# Patient Record
Sex: Male | Born: 1990 | Race: Black or African American | Hispanic: No | Marital: Single | Smoking: Light tobacco smoker
Health system: Southern US, Community
[De-identification: ages and names within clinical notes are randomized; demographics above are authoritative.]

## PROBLEM LIST (undated history)

## (undated) DIAGNOSIS — J45909 Unspecified asthma, uncomplicated: Secondary | ICD-10-CM

## (undated) HISTORY — PX: TONSILLECTOMY: SUR1361

---

## 2019-03-09 ENCOUNTER — Other Ambulatory Visit: Payer: Self-pay

## 2019-03-09 ENCOUNTER — Emergency Department (HOSPITAL_COMMUNITY)
Admission: EM | Admit: 2019-03-09 | Discharge: 2019-03-09 | Disposition: A | Discharge status: Home / Self Care | Payer: Self-pay | Attending: Emergency Medicine | Admitting: Emergency Medicine

## 2019-03-09 ENCOUNTER — Emergency Department (HOSPITAL_COMMUNITY): Payer: Self-pay

## 2019-03-09 DIAGNOSIS — Y939 Activity, unspecified: Secondary | ICD-10-CM | POA: Insufficient documentation

## 2019-03-09 DIAGNOSIS — Y929 Unspecified place or not applicable: Secondary | ICD-10-CM | POA: Insufficient documentation

## 2019-03-09 DIAGNOSIS — S60221A Contusion of right hand, initial encounter: Secondary | ICD-10-CM | POA: Insufficient documentation

## 2019-03-09 DIAGNOSIS — Y999 Unspecified external cause status: Secondary | ICD-10-CM | POA: Insufficient documentation

## 2019-03-09 DIAGNOSIS — W2209XA Striking against other stationary object, initial encounter: Secondary | ICD-10-CM | POA: Insufficient documentation
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
## 2019-08-08 DIAGNOSIS — Z5321 Procedure and treatment not carried out due to patient leaving prior to being seen by health care provider: Secondary | ICD-10-CM | POA: Insufficient documentation

## 2019-08-08 DIAGNOSIS — K0889 Other specified disorders of teeth and supporting structures: Secondary | ICD-10-CM | POA: Insufficient documentation

## 2019-08-09 ENCOUNTER — Encounter (HOSPITAL_COMMUNITY): Payer: Self-pay

## 2019-08-09 ENCOUNTER — Emergency Department (HOSPITAL_COMMUNITY): Payer: Self-pay

## 2019-08-09 ENCOUNTER — Emergency Department (HOSPITAL_COMMUNITY)
Admission: EM | Admit: 2019-08-09 | Discharge: 2019-08-09 | Disposition: A | Discharge status: Home / Self Care | Payer: Self-pay | Attending: Emergency Medicine | Admitting: Emergency Medicine

## 2019-08-09 ENCOUNTER — Inpatient Hospital Stay (HOSPITAL_COMMUNITY)
Admission: EM | Admit: 2019-08-09 | Discharge: 2019-08-11 | DRG: 137 | Disposition: A | Discharge status: Home / Self Care | Payer: Self-pay | Attending: Internal Medicine | Admitting: Internal Medicine

## 2019-08-09 ENCOUNTER — Other Ambulatory Visit: Payer: Self-pay

## 2019-08-09 DIAGNOSIS — K045 Chronic apical periodontitis: Secondary | ICD-10-CM | POA: Diagnosis present

## 2019-08-09 DIAGNOSIS — Z20828 Contact with and (suspected) exposure to other viral communicable diseases: Secondary | ICD-10-CM | POA: Diagnosis present

## 2019-08-09 DIAGNOSIS — M264 Malocclusion, unspecified: Secondary | ICD-10-CM | POA: Diagnosis present

## 2019-08-09 DIAGNOSIS — R220 Localized swelling, mass and lump, head: Secondary | ICD-10-CM

## 2019-08-09 DIAGNOSIS — K083 Retained dental root: Secondary | ICD-10-CM | POA: Diagnosis present

## 2019-08-09 DIAGNOSIS — L03211 Cellulitis of face: Secondary | ICD-10-CM | POA: Diagnosis present

## 2019-08-09 DIAGNOSIS — K047 Periapical abscess without sinus: Principal | ICD-10-CM

## 2019-08-09 DIAGNOSIS — K029 Dental caries, unspecified: Secondary | ICD-10-CM | POA: Diagnosis present

## 2019-08-09 LAB — COMPREHENSIVE METABOLIC PANEL
ALT: 12 U/L (ref 0–44)
AST: 16 U/L (ref 15–41)
Albumin: 4 g/dL (ref 3.5–5.0)
Alkaline Phosphatase: 57 U/L (ref 38–126)
Anion gap: 11 (ref 5–15)
BUN: 11 mg/dL (ref 6–20)
CO2: 27 mmol/L (ref 22–32)
Calcium: 9.2 mg/dL (ref 8.9–10.3)
Chloride: 100 mmol/L (ref 98–111)
Creatinine, Ser: 1.02 mg/dL (ref 0.61–1.24)
GFR calc Af Amer: >60 mL/min (ref >60)
GFR calc non Af Amer: >60 mL/min (ref >60)
Glucose, Bld: 104 mg/dL — ABNORMAL HIGH (ref 70–99)
Potassium: 3.7 mmol/L (ref 3.5–5.1)
Sodium: 138 mmol/L (ref 135–145)
Total Bilirubin: 0.9 mg/dL (ref 0.3–1.2)
Total Protein: 7.3 g/dL (ref 6.5–8.1)

## 2019-08-09 LAB — CBC WITH DIFFERENTIAL/PLATELET
Abs Immature Granulocytes: 0.02 10*3/uL (ref 0.00–0.07)
Basophils Absolute: 0 10*3/uL (ref 0.0–0.1)
Basophils Relative: 0 %
Eosinophils Absolute: 0 10*3/uL (ref 0.0–0.5)
Eosinophils Relative: 0 %
HCT: 43.5 % (ref 39.0–52.0)
Hemoglobin: 14.3 g/dL (ref 13.0–17.0)
Immature Granulocytes: 0 %
Lymphocytes Relative: 17 %
Lymphs Abs: 1.6 10*3/uL (ref 0.7–4.0)
MCH: 29.7 pg (ref 26.0–34.0)
MCHC: 32.9 g/dL (ref 30.0–36.0)
MCV: 90.2 fL (ref 80.0–100.0)
Monocytes Absolute: 1.2 10*3/uL — ABNORMAL HIGH (ref 0.1–1.0)
Monocytes Relative: 13 %
Neutro Abs: 6.6 10*3/uL (ref 1.7–7.7)
Neutrophils Relative %: 70 %
Platelets: 194 10*3/uL (ref 150–400)
RBC: 4.82 MIL/uL (ref 4.22–5.81)
RDW: 13.3 % (ref 11.5–15.5)
WBC: 9.5 10*3/uL (ref 4.0–10.5)
nRBC: 0 % (ref 0.0–0.2)

## 2019-08-09 LAB — LACTIC ACID, PLASMA: Lactic Acid, Venous: 0.7 mmol/L (ref 0.5–1.9)

## 2019-08-09 MED ORDER — ONDANSETRON HCL 4 MG/2ML IJ SOLN: 4.0000 mg | Freq: Four times a day (QID) | INTRAMUSCULAR | Status: DC | PRN

## 2019-08-09 MED ORDER — MORPHINE SULFATE (PF) 4 MG/ML IV SOLN
4.0000 mg | Freq: Once | INTRAVENOUS | Status: AC
  Administered 2019-08-09: 19:00:00 4 mg via INTRAVENOUS
  Filled 2019-08-09: qty 1

## 2019-08-09 MED ORDER — ONDANSETRON HCL 4 MG/2ML IJ SOLN
4.0000 mg | Freq: Once | INTRAMUSCULAR | Status: AC
  Administered 2019-08-09: 4 mg via INTRAVENOUS
  Filled 2019-08-09: qty 2

## 2019-08-09 MED ORDER — SODIUM CHLORIDE 0.9% FLUSH
3.0000 mL | Freq: Once | INTRAVENOUS | Status: AC
  Administered 2019-08-09: 3 mL via INTRAVENOUS

## 2019-08-09 MED ORDER — ACETAMINOPHEN 650 MG RE SUPP: 650.0000 mg | Freq: Four times a day (QID) | RECTAL | Status: DC | PRN

## 2019-08-09 MED ORDER — IOHEXOL 300 MG/ML  SOLN
75.0000 mL | Freq: Once | INTRAMUSCULAR | Status: AC | PRN
  Administered 2019-08-09: 75 mL via INTRAVENOUS

## 2019-08-09 MED ORDER — VANCOMYCIN HCL 10 G IV SOLR
1250.0000 mg | Freq: Two times a day (BID) | INTRAVENOUS | Status: DC
  Filled 2019-08-09 (×2): qty 1250

## 2019-08-09 MED ORDER — CLINDAMYCIN PHOSPHATE 600 MG/50ML IV SOLN
600.0000 mg | Freq: Once | INTRAVENOUS | Status: AC
  Administered 2019-08-09: 600 mg via INTRAVENOUS
  Filled 2019-08-09: qty 50

## 2019-08-09 MED ORDER — BUPIVACAINE HCL 0.25 % IJ SOLN
10.0000 mL | Freq: Once | INTRAMUSCULAR | Status: AC
  Administered 2019-08-09: 10 mL
  Filled 2019-08-09: qty 10

## 2019-08-09 MED ORDER — OXYCODONE-ACETAMINOPHEN 5-325 MG PO TABS
1.0000 | ORAL_TABLET | ORAL | Status: DC | PRN
  Administered 2019-08-09: 18:00:00 1 via ORAL
  Filled 2019-08-09: qty 1

## 2019-08-09 MED ORDER — CLINDAMYCIN PHOSPHATE 300 MG/50ML IV SOLN
300.0000 mg | Freq: Three times a day (TID) | INTRAVENOUS | Status: DC
  Administered 2019-08-10 – 2019-08-11 (×5): 300 mg via INTRAVENOUS
  Filled 2019-08-09 (×9): qty 50

## 2019-08-09 MED ORDER — SODIUM CHLORIDE 0.9 % IV BOLUS
1000.0000 mL | Freq: Once | INTRAVENOUS | Status: AC
  Administered 2019-08-09: 19:00:00 1000 mL via INTRAVENOUS

## 2019-08-09 MED ORDER — FENTANYL CITRATE (PF) 100 MCG/2ML IJ SOLN
100.0000 ug | Freq: Once | INTRAMUSCULAR | Status: AC
  Administered 2019-08-09: 100 ug via INTRAVENOUS
  Filled 2019-08-09 (×2): qty 2

## 2019-08-09 MED ORDER — DEXAMETHASONE SODIUM PHOSPHATE 10 MG/ML IJ SOLN
10.0000 mg | Freq: Once | INTRAMUSCULAR | Status: AC
  Administered 2019-08-09: 19:00:00 10 mg via INTRAVENOUS
  Filled 2019-08-09: qty 1

## 2019-08-09 MED ORDER — ACETAMINOPHEN 500 MG PO TABS
1000.0000 mg | ORAL_TABLET | Freq: Once | ORAL | Status: AC
  Administered 2019-08-09: 19:00:00 1000 mg via ORAL
  Filled 2019-08-09: qty 2

## 2019-08-09 MED ORDER — SODIUM CHLORIDE 0.9 % IV SOLN
INTRAVENOUS | Status: DC
  Administered 2019-08-09 – 2019-08-10 (×2): via INTRAVENOUS

## 2019-08-09 MED ORDER — ONDANSETRON HCL 4 MG/2ML IJ SOLN
4.0000 mg | Freq: Once | INTRAMUSCULAR | Status: AC
  Administered 2019-08-09: 4 mg via INTRAVENOUS
  Filled 2019-08-09 (×2): qty 2

## 2019-08-09 MED ORDER — ACETAMINOPHEN 325 MG PO TABS: 650.0000 mg | ORAL_TABLET | Freq: Four times a day (QID) | ORAL | Status: DC | PRN

## 2019-08-09 MED ORDER — MORPHINE SULFATE (PF) 2 MG/ML IV SOLN: 2.0000 mg | INTRAVENOUS | Status: DC | PRN

## 2019-08-09 MED ORDER — ONDANSETRON HCL 4 MG PO TABS: 4.0000 mg | ORAL_TABLET | Freq: Four times a day (QID) | ORAL | Status: DC | PRN

## 2019-08-09 NOTE — Progress Notes (Signed)
Pharmacy Antibiotic Note  Juan Hines is a 28 y.o. male admitted on 08/09/2019 with cellulitis.  Pharmacy has been consulted for Vancomycin dosing.  Height: 5\' 6"  (167.6 cm) Weight: 170 lb (77.1 kg) IBW/kg (Calculated) : 63.8  Temp (24hrs), Avg:99.4 F (37.4 C), Min:98.6 F (37 C), Max:101.1 F (38.4 C)  Recent Labs  Lab 08/09/19 1743 08/09/19 2136  WBC 9.5  --   CREATININE 1.02  --   LATICACIDVEN  --  0.7    Estimated Creatinine Clearance: 105.4 mL/min (by C-G formula based on SCr of 1.02 mg/dL).    No Known Allergies  Antimicrobials this admission: 11/9 Vancomycin >>   Microbiology results: N/a  Plan: - Will not load the patient since treatment for cellulitis  - Will start Vancomycin 1250 mg IV q12h  - Calculated est AUC 528 - Continue to monitor patient's renal function and opportunity to deescalate therapy when indicated.  Thank you for allowing pharmacy to be a part of this patient's care.  Duanne Limerick PharmD. BCPS  08/09/2019 10:39 PM

## 2019-08-09 NOTE — ED Triage Notes (Signed)
Pt reports right sided facial swelling and headache for the past 3 days, significant swelling noted. Pt a.o, maintaining secretions, airway intact

## 2019-08-09 NOTE — H&P (Signed)
History and Physical   Juan Hines WUJ:[id number] DOB: 04-Oct-1990 DOA: 08/09/2019  Referring MD/NP/PA: Dr. Rubin PayorPickering  PCP: Patient, No Pcp Per   Outpatient Specialists: None  Patient coming from: Home  Chief Complaint: Right facial swelling  HPI: Juan Hines is a 28 y.o. male with medical history significant of no significant past medical history who started having right lower tooth ache about 4 days ago.  Is status swelling with pain up to 10 out of 10.  Patient did not take anything but the pain continues to worsen and the swelling continues to worsen.  He has some fever and chills.  He started having problem swallowing with excessive salivation.  Patient came to the ER where he was seen and evaluated.  He was found to have severe dental abscess with accompanying facial cellulitis on the right.  He is beginning to have problems swallowing and with the severity of his presentation patient is being admitted for IV antibiotics and treatment.  ED Course: Temperature is 101.1 Hines pressure 150/83 pulse 80 respiratory rate of 18 oxygen sat 100% room air.  CBC and chemistry appear to be within normal.  Lactic acid 0.7.  COVID-19 is negative.  CT neck shows large periapical lucency at the root of tooth #30 with dehiscence of the anterior mandibular cortex and subperiosteal abscess of over 2.4 cm.  There was inflammation of the lower right facial soft tissues consistent with untoward neurogenic cellulitis with reactive cervical lymphadenopathy.  Review of Systems: As per HPI otherwise 10 point review of systems negative.    History reviewed. No pertinent past medical history.  History reviewed. No pertinent surgical history.   has no history on file for tobacco, alcohol, and drug.  No Known Allergies  No family history on file.   Prior to Admission medications   Not on File    Physical Exam: Vitals:   08/09/19 1724 08/09/19 1725 08/09/19 2129  BP: (!) 150/83    Pulse: 80     Resp: 18    Temp: (!) 101.1 F (38.4 C)  98.6 F (37 C)  TempSrc: Oral  Oral  SpO2: 100%    Weight:  77.1 kg   Height:  5\' 6"  (1.676 m)       Constitutional: NAD, calm, right facial swelling Vitals:   08/09/19 1724 08/09/19 1725 08/09/19 2129  BP: (!) 150/83    Pulse: 80    Resp: 18    Temp: (!) 101.1 F (38.4 C)  98.6 F (37 C)  TempSrc: Oral  Oral  SpO2: 100%    Weight:  77.1 kg   Height:  5\' 6"  (1.676 m)    Eyes: PERRL, lids and conjunctivae normal ENMT: Mucous membranes are moist. Posterior pharynx clear of any exudate or lesions.right lower jaw swollen tender warm to touch not fluctuant Neck: normal, supple, no masses, no thyromegaly Respiratory: clear to auscultation bilaterally, no wheezing, no crackles. Normal respiratory effort. No accessory muscle use.  Cardiovascular: Regular rate and rhythm, no murmurs / rubs / gallops. No extremity edema. 2+ pedal pulses. No carotid bruits.  Abdomen: no tenderness, no masses palpated. No hepatosplenomegaly. Bowel sounds positive.  Musculoskeletal: no clubbing / cyanosis. No joint deformity upper and lower extremities. Good ROM, no contractures. Normal muscle tone.  Skin: no rashes, lesions, ulcers. No induration Neurologic: CN 2-12 grossly intact. Sensation intact, DTR normal. Strength 5/5 in all 4.  Psychiatric: Normal judgment and insight. Alert and oriented x 3. Normal mood.     Labs  on Admission: I have personally reviewed following labs and imaging studies  CBC: Recent Labs  Lab 08/09/19 1743  WBC 9.5  NEUTROABS 6.6  HGB 14.3  HCT 43.5  MCV 90.2  PLT 166   Basic Metabolic Panel: Recent Labs  Lab 08/09/19 1743  NA 138  K 3.7  CL 100  CO2 27  GLUCOSE 104*  BUN 11  CREATININE 1.02  CALCIUM 9.2   GFR: Estimated Creatinine Clearance: 105.4 mL/min (by C-G formula based on SCr of 1.02 mg/dL). Liver Function Tests: Recent Labs  Lab 08/09/19 1743  AST 16  ALT 12  ALKPHOS 57  BILITOT 0.9  PROT 7.3   ALBUMIN 4.0   No results for input(s): LIPASE, AMYLASE in the last 168 hours. No results for input(s): AMMONIA in the last 168 hours. Coagulation Profile: No results for input(s): INR, PROTIME in the last 168 hours. Cardiac Enzymes: No results for input(s): CKTOTAL, CKMB, CKMBINDEX, TROPONINI in the last 168 hours. BNP (last 3 results) No results for input(s): PROBNP in the last 8760 hours. HbA1C: No results for input(s): HGBA1C in the last 72 hours. CBG: No results for input(s): GLUCAP in the last 168 hours. Lipid Profile: No results for input(s): CHOL, HDL, LDLCALC, TRIG, CHOLHDL, LDLDIRECT in the last 72 hours. Thyroid Function Tests: No results for input(s): TSH, T4TOTAL, FREET4, T3FREE, THYROIDAB in the last 72 hours. Anemia Panel: No results for input(s): VITAMINB12, FOLATE, FERRITIN, TIBC, IRON, RETICCTPCT in the last 72 hours. Urine analysis: No results found for: COLORURINE, APPEARANCEUR, LABSPEC, PHURINE, GLUCOSEU, HGBUR, BILIRUBINUR, KETONESUR, PROTEINUR, UROBILINOGEN, NITRITE, LEUKOCYTESUR Sepsis Labs: @LABRCNTIP (procalcitonin:4,lacticidven:4) )No results found for this or any previous visit (from the past 240 hour(s)).   Radiological Exams on Admission: Ct Soft Tissue Neck W Contrast  Result Date: 08/09/2019 CLINICAL DATA:  Sore throat and stridor. EXAM: CT NECK WITH CONTRAST TECHNIQUE: Multidetector CT imaging of the neck was performed using the standard protocol following the bolus administration of intravenous contrast. CONTRAST:  67mL OMNIPAQUE IOHEXOL 300 MG/ML  SOLN COMPARISON:  None. FINDINGS: PHARYNX AND LARYNX: --Nasopharynx: Fossae of Rosenmuller are clear. Normal adenoid tonsils for age. --Oral cavity and oropharynx: There is a large periapical lucency at the root of tooth 30 with dehiscence of the anterior mandibular cortex. There is a subperiosteal abscess measuring 2.4 by 1.4 cm. There is a large amount of overlying soft tissue swelling. --Hypopharynx: Normal  vallecula and pyriform sinuses. --Larynx: Normal epiglottis and pre-epiglottic space. Normal aryepiglottic and vocal folds. --Retropharyngeal space: No abscess, effusion or lymphadenopathy. SALIVARY GLANDS: --Parotid: No mass lesion or inflammation. No sialolithiasis or ductal dilatation. --Submandibular: Symmetric without inflammation. No sialolithiasis or ductal dilatation. --Sublingual: Normal. No ranula or other visible lesion of the base of tongue and floor of mouth. THYROID: Normal. LYMPH NODES: Level 1A lymph nodes measure up to 8 mm. There are numerous bilateral subcentimeter cervical lymph nodes. VASCULAR: Major cervical vessels are patent. LIMITED INTRACRANIAL: Normal. VISUALIZED ORBITS: Normal. MASTOIDS AND VISUALIZED PARANASAL SINUSES: No fluid levels or advanced mucosal thickening. No mastoid effusion. SKELETON: No bony spinal canal stenosis. No lytic or blastic lesions. UPPER CHEST: Clear. OTHER: None. IMPRESSION: 1. Large periapical lucency at the root of tooth 30 with dehiscence of the anterior mandibular cortex and subperiosteal abscess measuring up to 2.4 cm. 2. Inflammation of the lower right facial soft tissues, consistent with odontogenic cellulitis. 3. Reactive cervical lymphadenopathy. Electronically Signed   By: Ulyses Jarred M.D.   On: 08/09/2019 20:43      Assessment/Plan Principal  Problem:   Dental abscess Active Problems:   Facial cellulitis     #1 right dental abscess: Patient will be admitted for IV antibiotics.  I will start him on IV clindamycin.  We will consult dental surgery either in or outpatient once patient improves.  Get Hines cultures.  Pain management and fever management  #2 facial cellulitis: As a consequence of dental abscess.  Continue treatment with antibiotics as above   DVT prophylaxis: SCD Code Status: Full code Family Communication: The patient's significant other on the phone Disposition Plan: Home Consults called: None.  Dental surgery  consult in the morning Admission status: Observation  Severity of Illness: The appropriate patient status for this patient is OBSERVATION. Observation status is judged to be reasonable and necessary in order to provide the required intensity of service to ensure the patient's safety. The patient's presenting symptoms, physical exam findings, and initial radiographic and laboratory data in the context of their medical condition is felt to place them at decreased risk for further clinical deterioration. Furthermore, it is anticipated that the patient will be medically stable for discharge from the hospital within 2 midnights of admission. The following factors support the patient status of observation.   " The patient's presenting symptoms include right facial swelling. " The physical exam findings include swelling and tenderness of the right face. " The initial radiographic and laboratory data are dental abscess.     Juan Blood MD [facility] Pager [phone]  If 7PM-7AM, please contact night-coverage [email or web address] Password [email or web address]  08/09/2019, 10:14 PM

## 2019-08-09 NOTE — ED Triage Notes (Signed)
Per pt he has been having facial swelling with jaw pain for about 2 days. Painful to chew. Pt said back jaw tooth is bad.

## 2019-08-09 NOTE — ED Notes (Signed)
Pt did not respond when called for vitals check. Registration stated she seen pt leave and has not seen him return

## 2019-08-09 NOTE — ED Provider Notes (Signed)
[name] Provider Note   CSN: [id number] Arrival date & time: 08/09/19  1641     History   Chief Complaint Chief Complaint  Patient presents with  . Dental Pain  . Facial Swelling    HPI Juan Hines is a 28 y.o. male who presents for evaluation of 5 days of dental pain to the right lower jaw.  He states that over the last 2 days, he has had worsening pain, swelling of the right face.  He states he has been taking over-the-counter pain medication with no improvement in symptoms.  He states that he has not noticed any fever at home.  He states that he can still tolerate his secretions and p.o. but states he has had a very soft food because it hurts more to eat.  He states he also has a headache that extends across the front of his head.  He states that he feels like the pain radiated to his right ear.  Patient Nuys any difficulty breathing, vomiting.      The history is provided by the patient.    History reviewed. No pertinent past medical history.  Patient Active Problem List   Diagnosis Date Noted  . Dental abscess 08/09/2019  . Facial cellulitis 08/09/2019    History reviewed. No pertinent surgical history.      Home Medications    Prior to Admission medications   Not on File    Family History No family history on file.  Social History Social History   Tobacco Use  . Smoking status: Not on file  Substance Use Topics  . Alcohol use: Not on file  . Drug use: Not on file     Allergies   Patient has no known allergies.   Review of Systems Review of Systems  Constitutional: Negative for fever.  HENT: Positive for dental problem and facial swelling.   Respiratory: Negative for shortness of breath.   Gastrointestinal: Negative for nausea and vomiting.  All other systems reviewed and are negative.    Physical Exam Updated Vital Signs BP (!) 150/83   Pulse 80   Temp 98.6 F (37 C) (Oral)   Resp 18   Ht  5\' 6"  (1.676 m)   Wt 77.1 kg   SpO2 100%   BMI 27.44 kg/m   Physical Exam Vitals signs and nursing note reviewed.  Constitutional:      Appearance: He is well-developed.  HENT:     Head: Normocephalic and atraumatic.     Comments: Patient is asymmetric in appearance with significant edema, tenderness noted to right lower face that extends to the right chin.    Mouth/Throat:     Comments: Trismus noted.  He can open to about two fingers width.  He has significant edema, tenderness noted to the right side of his face.  Limited evaluation of posterior oropharynx.  He does have tenderness noted to the right lower jaw but no obvious abscess or area of fluctuance.  He does have tenderness noted to the submandibular region that extends to the chin.  No obvious overlying swelling. Eyes:     General: No scleral icterus.       Right eye: No discharge.        Left eye: No discharge.     Conjunctiva/sclera: Conjunctivae normal.  Pulmonary:     Effort: Pulmonary effort is normal.  Skin:    General: Skin is warm and dry.  Neurological:     Mental  Status: He is alert.  Psychiatric:        Speech: Speech normal.        Behavior: Behavior normal.      ED Treatments / Results  Labs (all labs ordered are listed, but only abnormal results are displayed) Labs Reviewed  COMPREHENSIVE METABOLIC PANEL - Abnormal; Notable for the following components:      Result Value   Glucose, Bld 104 (*)    All other components within normal limits  CBC WITH DIFFERENTIAL/PLATELET - Abnormal; Notable for the following components:   Monocytes Absolute 1.2 (*)    All other components within normal limits  SARS CORONAVIRUS 2 (TAT 6-24 HRS)  LACTIC ACID, PLASMA  HIV ANTIBODY (ROUTINE TESTING W REFLEX)  COMPREHENSIVE METABOLIC PANEL  CBC    EKG None  Radiology Ct Soft Tissue Neck W Contrast  Result Date: 08/09/2019 CLINICAL DATA:  Sore throat and stridor. EXAM: CT NECK WITH CONTRAST TECHNIQUE:  Multidetector CT imaging of the neck was performed using the standard protocol following the bolus administration of intravenous contrast. CONTRAST:  75mL OMNIPAQUE IOHEXOL 300 MG/ML  SOLN COMPARISON:  None. FINDINGS: PHARYNX AND LARYNX: --Nasopharynx: Fossae of Rosenmuller are clear. Normal adenoid tonsils for age. --Oral cavity and oropharynx: There is a large periapical lucency at the root of tooth 30 with dehiscence of the anterior mandibular cortex. There is a subperiosteal abscess measuring 2.4 by 1.4 cm. There is a large amount of overlying soft tissue swelling. --Hypopharynx: Normal vallecula and pyriform sinuses. --Larynx: Normal epiglottis and pre-epiglottic space. Normal aryepiglottic and vocal folds. --Retropharyngeal space: No abscess, effusion or lymphadenopathy. SALIVARY GLANDS: --Parotid: No mass lesion or inflammation. No sialolithiasis or ductal dilatation. --Submandibular: Symmetric without inflammation. No sialolithiasis or ductal dilatation. --Sublingual: Normal. No ranula or other visible lesion of the base of tongue and floor of mouth. THYROID: Normal. LYMPH NODES: Level 1A lymph nodes measure up to 8 mm. There are numerous bilateral subcentimeter cervical lymph nodes. VASCULAR: Major cervical vessels are patent. LIMITED INTRACRANIAL: Normal. VISUALIZED ORBITS: Normal. MASTOIDS AND VISUALIZED PARANASAL SINUSES: No fluid levels or advanced mucosal thickening. No mastoid effusion. SKELETON: No bony spinal canal stenosis. No lytic or blastic lesions. UPPER CHEST: Clear. OTHER: None. IMPRESSION: 1. Large periapical lucency at the root of tooth 30 with dehiscence of the anterior mandibular cortex and subperiosteal abscess measuring up to 2.4 cm. 2. Inflammation of the lower right facial soft tissues, consistent with odontogenic cellulitis. 3. Reactive cervical lymphadenopathy. Electronically Signed   By: Deatra Robinson M.D.   On: 08/09/2019 20:43    Procedures .Marland KitchenIncision and Drainage   Date/Time: 08/09/2019 9:52 PM Performed by: Maxwell Caul, PA-C Authorized by: Maxwell Caul, PA-C   Consent:    Consent obtained:  Verbal   Consent given by:  Patient   Risks discussed:  Bleeding, incomplete drainage, pain and damage to other organs   Alternatives discussed:  No treatment Universal protocol:    Procedure explained and questions answered to patient or proxy's satisfaction: yes     Relevant documents present and verified: yes     Test results available and properly labeled: yes     Imaging studies available: yes     Required blood products, implants, devices, and special equipment available: yes     Site/side marked: yes     Immediately prior to procedure a time out was called: yes     Patient identity confirmed:  Verbally with patient Location:    Type:  Abscess  Location:  Mouth Pre-procedure details:    Skin preparation:  Betadine Anesthesia (see MAR for exact dosages):    Anesthesia method:  Local infiltration   Local anesthetic:  Bupivacaine 0.25% w/o epi Procedure type:    Complexity:  Complex Procedure details:    Incision types:  Single straight   Incision depth:  Subcutaneous   Scalpel blade:  11   Wound management:  Probed and deloculated, irrigated with saline and extensive cleaning   Drainage:  Purulent   Drainage amount:  Moderate Post-procedure details:    Patient tolerance of procedure:  Tolerated well, no immediate complications   (including critical care time)  Medications Ordered in ED Medications  oxyCODONE-acetaminophen (PERCOCET/ROXICET) 5-325 MG per tablet 1 tablet (1 tablet Oral Given 08/09/19 1731)  0.9 %  sodium chloride infusion ( Intravenous New Bag/Given 08/09/19 2258)  acetaminophen (TYLENOL) tablet 650 mg (has no administration in time range)    Or  acetaminophen (TYLENOL) suppository 650 mg (has no administration in time range)  ondansetron (ZOFRAN) tablet 4 mg (has no administration in time range)    Or   ondansetron (ZOFRAN) injection 4 mg (has no administration in time range)  clindamycin (CLEOCIN) IVPB 300 mg (has no administration in time range)  morphine 2 MG/ML injection 2 mg (has no administration in time range)  sodium chloride flush (NS) 0.9 % injection 3 mL (3 mLs Intravenous Given 08/09/19 1901)  sodium chloride 0.9 % bolus 1,000 mL (0 mLs Intravenous Stopped 08/09/19 2127)  acetaminophen (TYLENOL) tablet 1,000 mg (1,000 mg Oral Given 08/09/19 1901)  ondansetron (ZOFRAN) injection 4 mg (4 mg Intravenous Given 08/09/19 1901)  morphine 4 MG/ML injection 4 mg (4 mg Intravenous Given 08/09/19 1901)  dexamethasone (DECADRON) injection 10 mg (10 mg Intravenous Given 08/09/19 1901)  iohexol (OMNIPAQUE) 300 MG/ML solution 75 mL (75 mLs Intravenous Contrast Given 08/09/19 2004)  clindamycin (CLEOCIN) IVPB 600 mg (0 mg Intravenous Stopped 08/09/19 2158)  bupivacaine (MARCAINE) 0.25 % (with pres) injection 10 mL (10 mLs Infiltration Given 08/09/19 2221)  fentaNYL (SUBLIMAZE) injection 100 mcg (100 mcg Intravenous Given 08/09/19 2302)  ondansetron (ZOFRAN) injection 4 mg (4 mg Intravenous Given 08/09/19 2302)     Initial Impression / Assessment and Plan / ED Course  I have reviewed the triage vital signs and the nursing notes.  Pertinent labs & imaging results that were available during my care of the patient were reviewed by me and considered in my medical decision making (see chart for details).        28 year old male who presents for evaluation of 5 days of right dental pain as well as 2 days of pain, swelling.  On initial ED arrival, he is febrile, hypertensive.  Vitals otherwise stable.  He is tolerating secretions and does not have any difficulty breathing.  He does have some trismus noted on exam and face is asymmetric with edema noted to the right lower face.  Concern for dental abscess versus Ludwig angina versus posterior oropharynx abscess.  Labs ordered at triage.  We will plan to obtain  CT soft tissue neck.  CBC shows no leukocytosis.  Hemoglobin stable.  CMP is unremarkable.  CT shows large periapical lucency at the root of tooth #30 with dehiscence of the anterior mandibular cortex with subperiosteal abscess measuring up to 2.4 cm.  He also has inflammation of the right lower facial soft tissues consistent with cellulitis.  Given findings of abscess, I discussed incision and drainage with patient.  He is agreeable.  Incision and drainage of the abscess as documented above.  There was a good amount of purulent drainage.  He did have some improvement in pain.  Given concerns of fever, cellulitis surrounding the abscess, feel that he would best be served by admission with IV antibiotics.  Discussed with Dr. Rubin Payor who agrees to plan.  Discussed patient with Dr. Mikeal Hawthorne (hospitalist) who accepts patient for admission.  Portions of this note were generated with Scientist, clinical (histocompatibility and immunogenetics). Dictation errors may occur despite best attempts at proofreading.  Final Clinical Impressions(s) / ED Diagnoses   Final diagnoses:  Dental abscess  Facial cellulitis    ED Discharge Orders    None       Rosana Hoes 08/09/19 2348    Benjiman Core, MD 08/12/19 [phone]

## 2019-08-10 ENCOUNTER — Encounter (HOSPITAL_COMMUNITY): Payer: Self-pay

## 2019-08-10 ENCOUNTER — Inpatient Hospital Stay (HOSPITAL_COMMUNITY): Payer: Self-pay

## 2019-08-10 DIAGNOSIS — L03211 Cellulitis of face: Secondary | ICD-10-CM

## 2019-08-10 LAB — COMPREHENSIVE METABOLIC PANEL
ALT: 10 U/L (ref 0–44)
AST: 16 U/L (ref 15–41)
Albumin: 3.3 g/dL — ABNORMAL LOW (ref 3.5–5.0)
Alkaline Phosphatase: 53 U/L (ref 38–126)
Anion gap: 9 (ref 5–15)
BUN: 9 mg/dL (ref 6–20)
CO2: 24 mmol/L (ref 22–32)
Calcium: 8.9 mg/dL (ref 8.9–10.3)
Chloride: 102 mmol/L (ref 98–111)
Creatinine, Ser: 1.08 mg/dL (ref 0.61–1.24)
GFR calc Af Amer: >60 mL/min (ref >60)
GFR calc non Af Amer: >60 mL/min (ref >60)
Glucose, Bld: 145 mg/dL — ABNORMAL HIGH (ref 70–99)
Potassium: 4.3 mmol/L (ref 3.5–5.1)
Sodium: 135 mmol/L (ref 135–145)
Total Bilirubin: 0.7 mg/dL (ref 0.3–1.2)
Total Protein: 6.2 g/dL — ABNORMAL LOW (ref 6.5–8.1)

## 2019-08-10 LAB — CBC
HCT: 41.2 % (ref 39.0–52.0)
Hemoglobin: 13.6 g/dL (ref 13.0–17.0)
MCH: 29.7 pg (ref 26.0–34.0)
MCHC: 33 g/dL (ref 30.0–36.0)
MCV: 90 fL (ref 80.0–100.0)
Platelets: 197 10*3/uL (ref 150–400)
RBC: 4.58 MIL/uL (ref 4.22–5.81)
RDW: 13.2 % (ref 11.5–15.5)
WBC: 8.8 10*3/uL (ref 4.0–10.5)
nRBC: 0 % (ref 0.0–0.2)

## 2019-08-10 LAB — SARS CORONAVIRUS 2 (TAT 6-24 HRS): SARS Coronavirus 2: NEGATIVE

## 2019-08-10 LAB — HIV ANTIBODY (ROUTINE TESTING W REFLEX): HIV Screen 4th Generation wRfx: NONREACTIVE

## 2019-08-10 MED ORDER — IBUPROFEN 400 MG PO TABS
400.0000 mg | ORAL_TABLET | Freq: Three times a day (TID) | ORAL | Status: DC
  Administered 2019-08-10 – 2019-08-11 (×2): 400 mg via ORAL
  Filled 2019-08-10 (×2): qty 1

## 2019-08-10 NOTE — Consult Note (Signed)
DENTAL CONSULTATION  Date of Consultation:  08/10/2019 Patient Name:   Juan Hines Date of Birth:   [birth year] Medical Record Number: [id number]  COVID 19 SCREENING: The patient does not symptoms concerning for COVID-19 infection (Including fever, chills, cough, or new SHORTNESS OF BREATH).    VITALS: BP 117/69 (BP Location: Right Arm)   Pulse 76   Temp 98.3 F (36.8 C) (Oral)   Resp 18   Ht 5\' 6"  (1.676 m)   Wt 77.1 kg   SpO2 100%   BMI 27.44 kg/m   CHIEF COMPLAINT: Patient referred by Dr. Algis Liming for a dental consultation.  HPI: Juan Hines is a 28 year old male recently admitted and placed in observation for right facial swelling.  Patient has been on IV antibiotic therapy originally with vancomycin and now clindamycin.  Dental consultation requested for evaluation of patient for dental etiology of the right facial swelling.    The patient has been having a history of dental pain involving the lower right quadrant since 08/07/2019.  The patient points to tooth #30 as the offending tooth.  The pain and swelling worsened to the point that the patient went to the emergency department on Monday.  Patient was subsequently admitted and placed on IV antibiotic therapy.  The swelling and discomfort have subsided significantly since he has been administered the IV antibiotics.  Patient last saw a dentist approximately a year ago for a dental cleaning in [place], [place].  The patient cannot remember the name of the dentist.  The patient denies having partial dentures.  Patient denies having dental phobia.  PROBLEM LIST: Patient Active Problem List   Diagnosis Date Noted  . Dental abscess 08/09/2019  . Facial cellulitis 08/09/2019    PMH: History reviewed. No pertinent past medical history.  PSH: History reviewed. No pertinent surgical history.  ALLERGIES: No Known Allergies  MEDICATIONS: Current Facility-Administered Medications  Medication Dose Route Frequency  Provider Last Rate Last Dose  . 0.9 %  sodium chloride infusion   Intravenous Continuous Elwyn Reach, MD 100 mL/hr at 08/09/19 2258    . acetaminophen (TYLENOL) tablet 650 mg  650 mg Oral Q6H PRN Elwyn Reach, MD       Or  . acetaminophen (TYLENOL) suppository 650 mg  650 mg Rectal Q6H PRN Gala Romney L, MD      . clindamycin (CLEOCIN) IVPB 300 mg  300 mg Intravenous Q8H Elwyn Reach, MD   Stopped at 08/10/19 0734  . morphine 2 MG/ML injection 2 mg  2 mg Intravenous Q3H PRN Gala Romney L, MD      . ondansetron (ZOFRAN) tablet 4 mg  4 mg Oral Q6H PRN Elwyn Reach, MD       Or  . ondansetron (ZOFRAN) injection 4 mg  4 mg Intravenous Q6H PRN Elwyn Reach, MD      . oxyCODONE-acetaminophen (PERCOCET/ROXICET) 5-325 MG per tablet 1 tablet  1 tablet Oral Q30 min PRN Davonna Belling, MD   1 tablet at 08/09/19 1731    LABS: Lab Results  Component Value Date   WBC 8.8 08/10/2019   HGB 13.6 08/10/2019   HCT 41.2 08/10/2019   MCV 90.0 08/10/2019   PLT 197 08/10/2019      Component Value Date/Time   NA 135 08/10/2019 0501   K 4.3 08/10/2019 0501   CL 102 08/10/2019 0501   CO2 24 08/10/2019 0501   GLUCOSE 145 (H) 08/10/2019 0501   BUN 9 08/10/2019 0501  CREATININE 1.08 08/10/2019 0501   CALCIUM 8.9 08/10/2019 0501   GFRNONAA >60 08/10/2019 0501   GFRAA >60 08/10/2019 0501   No results found for: INR, PROTIME No results found for: PTT  SOCIAL HISTORY: Social History   Socioeconomic History  . Marital status: Single    Spouse name: Not on file  . Number of children: Not on file  . Years of education: Not on file  . Highest education level: Not on file  Occupational History  . Not on file  Social Needs  . Financial resource strain: Not on file  . Food insecurity    Worry: Not on file    Inability: Not on file  . Transportation needs    Medical: Not on file    Non-medical: Not on file  Tobacco Use  . Smoking status: Light Tobacco Smoker  .  Smokeless tobacco: Never Used  Substance and Sexual Activity  . Alcohol use: Not Currently  . Drug use: Not on file  . Sexual activity: Not on file  Lifestyle  . Physical activity    Days per week: Not on file    Minutes per session: Not on file  . Stress: Not on file  Relationships  . Social Musician on phone: Not on file    Gets together: Not on file    Attends religious service: Not on file    Active member of club or organization: Not on file    Attends meetings of clubs or organizations: Not on file    Relationship status: Not on file  . Intimate partner violence    Fear of current or ex partner: Not on file    Emotionally abused: Not on file    Physically abused: Not on file    Forced sexual activity: Not on file  Other Topics Concern  . Not on file  Social History Narrative  . Not on file    FAMILY HISTORY: History reviewed. No pertinent family history.  REVIEW OF SYSTEMS: Reviewed with the patient as per History of present illness. Psych: Patient denies having dental phobia.  DENTAL HISTORY: CHIEF COMPLAINT: Patient referred by Dr. Waymon Amato for a dental consultation.  HPI: Juan Hines is a 28 year old male recently admitted and placed in observation for right facial swelling.  Patient has been on IV antibiotic therapy originally with vancomycin and now clindamycin.  Dental consultation requested for evaluation of patient for dental etiology of the right facial swelling.    The patient has been having a history of dental pain involving the lower right quadrant since 08/07/2019.  The patient points to tooth #30 as the offending tooth.  The pain and swelling worsened to the point that the patient went to the emergency department on Monday.  Patient was subsequently admitted and placed on IV antibiotic therapy.  The swelling and discomfort have subsided significantly since he has been administered the IV antibiotics.  Patient last saw a dentist  approximately a year ago for a dental cleaning in [place], [place].  The patient cannot remember the name of the dentist.  The patient denies having partial dentures.  Patient denies having dental phobia.  DENTAL EXAMINATION: GENERAL: The patient is a well-developed, well-nourished male in no acute distress. HEAD AND NECK: The patient has right facial swelling and right submandibular lymphadenopathy.  This is tender to palpation. INTRAORAL EXAM: There is limited opening currently measured at 20 mm.  The patient has normal saliva.  There is evidence  of right buccal swelling in the area of #30. DENTITION: Patient is not missing any teeth.  Patient has retained root segments in the area of tooth numbers 1, 14, 16, and extensive caries associated with tooth #30. PERIODONTAL: The patient has chronic periodontitis with plaque and calculus accumulations, selective areas of gingival recession, and incipient to moderate bone loss. DENTAL CARIES/SUBOPTIMAL RESTORATIONS: Multiple dental caries are noted. ENDODONTIC: Patient has periapical pathology associated with the roots of tooth numbers 1, 14, 16, and 30. CROWN AND BRIDGE: There are no crown or bridge restorations noted. PROSTHODONTIC: There are no partial dentures. OCCLUSION: Patient has a poor occlusal scheme and malocclusion secondary to multiple missing teeth, multiple retained root segments, and multiple diastemas.  RADIOGRAPHIC INTERPRETATION: An orthopantogram was taken today. There are retained root segments in the area tooth numbers 1, 14, and 16.  There are extensive caries associated with tooth #30.  There is periapical pathology and radiolucency associated tooth numbers 1, 14, 16, and 30.  Multiple dental caries are noted.  Multiple diastemas are noted.  Dental caries are noted.  There is incipient to moderate bone loss noted.   ASSESSMENTS: 1.  Right facial swelling 2.  History of acute pulpitis 3.  Chronic apical  periodontitis 4.  Multiple retained root segments 5.  Multiple dental caries 6.  Chronic periodontitis with bone loss 7.  Gingival recession 8.  Accretions 9.  Multiple diastemas 10.  Poor occlusal scheme and malocclusion 11.  Limited opening currently measured at approximately 20 mm  PLAN/RECOMMENDATIONS: 1. I discussed the risks, benefits, and complications of various treatment options with the patient in relationship to his medical and dental conditions. We discussed various treatment options to include no treatment, multiple extractions with alveoloplasty, periodontal therapy, dental restorations, root canal therapy, crown and bridge therapy, implant therapy, and replacement of missing teeth as indicated.  We also discussed need for referral to an oral surgeon due to the complexity of anticipated extraction of tooth #30 with consideration for extraction of tooth numbers 1, 14, and 16 as well if patient so desires.  Ideally, the patient would remain admitted with IV antibiotic therapy with discharge at the discretion of the medical team and subsequent referral and coordination of treatment with Dr. Valda FaviaShane McDaniel, oral surgeon, as an outpatient once discharged.  The patient has been given the information to contact Dr. Julien GirtMcDaniel in his discharge planning records.  Patient will then need to follow-up with a dentist of his choice for continued dental care and treatment as indicated.   2. Discussion of findings with medical team and coordination of future medical and dental care as needed.    Charlynne Panderonald F. Jayliah Benett, DDS

## 2019-08-10 NOTE — Progress Notes (Signed)
RN paged NP that pt wanted to leave AMA. Chart reviewed. NP spoke to pt over phone. NP explained the seriousness of his issue and the fact that this cellulitis could spread if he leaves and does not get the course of IV antibiotics. I explained that his condition could likely worsen if he leaves because he would not receive any antibiotics to go home on. NP explained that cellulitis of the face is a very serious issue and could result in the infection spreading to the neck which could cause life threatening airway compromise. After this explanation, he decided to stay.  KJKG, NP [facility]

## 2019-08-10 NOTE — Progress Notes (Addendum)
PROGRESS NOTE   Juan Hines  FGH:[id number]    DOB: Mar 20, 1991    DOA: 08/09/2019  PCP: Patient, No Pcp Per   I have briefly reviewed patients previous medical records in [facility].  Chief Complaint  Patient presents with   Dental Pain   Facial Swelling    Brief Narrative:  28 year old male with no known PMH, presented to [facility] ED on 11/9 due to 3 to 4 days history of right lower tooth pain which progressively worsened with associated swelling of the right lower face/jaw and some problems with swallowing with excessive salivation.  Admitted for dental abscess with facial cellulitis.  No current concerns for airway compromise, however will need close monitoring.  Dental medicine consultation appreciated.   Assessment & Plan:   Principal Problem:   Dental abscess Active Problems:   Facial cellulitis   Dental abscess with facial cellulitis  CT soft tissue neck with contrast: Large periapical lucency at the root of tooth 30 with dehiscence of the anterior mandibular cortex and subperiosteal abscess measuring up to 2.4 cm.  Inflammation of the lower right facial soft tissues, consistent with odontogenic cellulitis and reactive lymphadenopathy.  Orthopantogram: Multiple dental caries.  Periapical lucency at tooth #30.  Continue empirically started IV clindamycin.  I discussed with Dr. Robin Searing, dental medicine who recommended continuing IV antibiotic and when medically improved, outpatient consultation with Dr. Lalla Brothers, oral surgeon for dental extraction and other management.  Dr. Robin Searing has provided patient with Dr. Julien Girt contact information and forwarded information to his office as well.  He recommends consulting ENT surgeons stat if patient were to develop any symptoms of airway compromise.  May need 24-48 hours of IV antibiotic prior to transitioning to oral at discharge.  Added NSAIDs.  Improving.  Patient was advised extensively in the presence of RN  that he should not attempt to leave AMA in which case there is a high risk of further worsening of his medical condition with associated complications including airway compromise etc.  He verbalized understanding.    DVT prophylaxis: SCD Code Status: Full Family Communication: None at bedside Disposition: DC home pending clinical improvement   Consultants:  Dental medicine  Procedures:  None  Antimicrobials:  IV clindamycin   Subjective: Patient interviewed and examined along with RN in room.  Reports that he feels much better.  Right-sided facial swelling and pain have significantly improved.  Had some drainage from the right lower tooth area this morning when he woke up.  Able to eat and drink without any swallowing difficulties.  Denies difficulty breathing or noisy breathing.  Objective:  Vitals:   08/10/19 0412 08/10/19 1020 08/10/19 1048 08/10/19 1400  BP: 100/75 116/68 117/69 (!) 112/57  Pulse: 75 78 76 74  Resp: 16 16 18 16   Temp:   98.3 F (36.8 C) 98.4 F (36.9 C)  TempSrc:   Oral Oral  SpO2: 95% 98% 100% 100%  Weight:      Height:        Examination:  General exam: Pleasant young male, well built and nourished lying comfortably propped up in bed. ENT: Right lower facial/jaw swelling, induration, increased warmth and tenderness without fluctuation.  Limited opening of the oral cavity due to pain and swelling.  Some swelling in the area of the right lower tooth #30.  No obvious drainage but has a gauze pack in that area. Respiratory system: Clear to auscultation. Respiratory effort normal. Cardiovascular system: S1 & S2 heard, RRR. No JVD, murmurs,  rubs, gallops or clicks. No pedal edema. Gastrointestinal system: Abdomen is nondistended, soft and nontender. No organomegaly or masses felt. Normal bowel sounds heard. Central nervous system: Alert and oriented. No focal neurological deficits. Extremities: Symmetric 5 x 5 power. Skin: No rashes, lesions or  ulcers Psychiatry: Judgement and insight appear normal. Mood & affect appropriate.     Data Reviewed: I have personally reviewed following labs and imaging studies  CBC: Recent Labs  Lab 08/09/19 1743 08/10/19 0501  WBC 9.5 8.8  NEUTROABS 6.6  --   HGB 14.3 13.6  HCT 43.5 41.2  MCV 90.2 90.0  PLT 194 197   Basic Metabolic Panel: Recent Labs  Lab 08/09/19 1743 08/10/19 0501  NA 138 135  K 3.7 4.3  CL 100 102  CO2 27 24  GLUCOSE 104* 145*  BUN 11 9  CREATININE 1.02 1.08  CALCIUM 9.2 8.9   Liver Function Tests: Recent Labs  Lab 08/09/19 1743 08/10/19 0501  AST 16 16  ALT 12 10  ALKPHOS 57 53  BILITOT 0.9 0.7  PROT 7.3 6.2*  ALBUMIN 4.0 3.3*    Cardiac Enzymes: No results for input(s): CKTOTAL, CKMB, CKMBINDEX, TROPONINI in the last 168 hours.  CBG: No results for input(s): GLUCAP in the last 168 hours.  Recent Results (from the past 240 hour(s))  SARS CORONAVIRUS 2 (TAT 6-24 HRS) Nasopharyngeal Nasopharyngeal Swab     Status: None   Collection Time: 08/09/19 10:24 PM   Specimen: Nasopharyngeal Swab  Result Value Ref Range Status   SARS Coronavirus 2 NEGATIVE NEGATIVE Final    Comment: (NOTE) SARS-CoV-2 target nucleic acids are NOT DETECTED. The SARS-CoV-2 RNA is generally detectable in upper and lower respiratory specimens during the acute phase of infection. Negative results do not preclude SARS-CoV-2 infection, do not rule out co-infections with other pathogens, and should not be used as the sole basis for treatment or other patient management decisions. Negative results must be combined with clinical observations, patient history, and epidemiological information. The expected result is Negative. Fact Sheet for Patients: HairSlick.nohttps://www.fda.gov/media/138098/download Fact Sheet for Healthcare Providers: quierodirigir.comhttps://www.fda.gov/media/138095/download This test is not yet approved or cleared by the [place] FDA and  has been authorized for detection  and/or diagnosis of SARS-CoV-2 by FDA under an Emergency Use Authorization (EUA). This EUA will remain  in effect (meaning this test can be used) for the duration of the COVID-19 declaration under Section 56 4(b)(1) of the Act, 21 U.S.C. section 360bbb-3(b)(1), unless the authorization is terminated or revoked sooner. Performed at [facility], [address] [place], [place] [address]          Radiology Studies: Dg Orthopantogram  Result Date: 08/10/2019 CLINICAL DATA:  Right lower jaw/facial swelling EXAM: ORTHOPANTOGRAM/PANORAMIC COMPARISON:  None. FINDINGS: Multiple dental caries present including teeth 1, 14, 16, and 30. There is periapical lucency at tooth 30. No destructive lesion. IMPRESSION: Multiple dental caries.  Periapical lucency at tooth #30. Electronically Signed   By: Guadlupe SpanishPraneil  Patel M.D.   On: 08/10/2019 13:07   Ct Soft Tissue Neck W Contrast  Result Date: 08/09/2019 CLINICAL DATA:  Sore throat and stridor. EXAM: CT NECK WITH CONTRAST TECHNIQUE: Multidetector CT imaging of the neck was performed using the standard protocol following the bolus administration of intravenous contrast. CONTRAST:  75mL OMNIPAQUE IOHEXOL 300 MG/ML  SOLN COMPARISON:  None. FINDINGS: PHARYNX AND LARYNX: --Nasopharynx: Fossae of Rosenmuller are clear. Normal adenoid tonsils for age. --Oral cavity and oropharynx: There is a large periapical  lucency at the root of tooth 30 with dehiscence of the anterior mandibular cortex. There is a subperiosteal abscess measuring 2.4 by 1.4 cm. There is a large amount of overlying soft tissue swelling. --Hypopharynx: Normal vallecula and pyriform sinuses. --Larynx: Normal epiglottis and pre-epiglottic space. Normal aryepiglottic and vocal folds. --Retropharyngeal space: No abscess, effusion or lymphadenopathy. SALIVARY GLANDS: --Parotid: No mass lesion or inflammation. No sialolithiasis or ductal dilatation. --Submandibular: Symmetric without inflammation. No  sialolithiasis or ductal dilatation. --Sublingual: Normal. No ranula or other visible lesion of the base of tongue and floor of mouth. THYROID: Normal. LYMPH NODES: Level 1A lymph nodes measure up to 8 mm. There are numerous bilateral subcentimeter cervical lymph nodes. VASCULAR: Major cervical vessels are patent. LIMITED INTRACRANIAL: Normal. VISUALIZED ORBITS: Normal. MASTOIDS AND VISUALIZED PARANASAL SINUSES: No fluid levels or advanced mucosal thickening. No mastoid effusion. SKELETON: No bony spinal canal stenosis. No lytic or blastic lesions. UPPER CHEST: Clear. OTHER: None. IMPRESSION: 1. Large periapical lucency at the root of tooth 30 with dehiscence of the anterior mandibular cortex and subperiosteal abscess measuring up to 2.4 cm. 2. Inflammation of the lower right facial soft tissues, consistent with odontogenic cellulitis. 3. Reactive cervical lymphadenopathy. Electronically Signed   By: Ulyses Jarred M.D.   On: 08/09/2019 20:43        Scheduled Meds: Continuous Infusions:  sodium chloride 100 mL/hr at 08/10/19 1415   clindamycin (CLEOCIN) IV Stopped (08/10/19 0734)     LOS: 1 day     Vernell Leep, MD, FACP, [facility]. [facility]  To contact the attending provider between 7A-7P or the covering provider during after hours 7P-7A, please log into the web site [email or web address] and access using universal [place] password for that web site. If you do not have the password, please call the hospital operator.  08/10/2019, 2:30 PM

## 2019-08-10 NOTE — ED Notes (Signed)
MS   Breakfast ordered  

## 2019-08-11 MED ORDER — IBUPROFEN 400 MG PO TABS: 400.0000 mg | ORAL_TABLET | Freq: Four times a day (QID) | ORAL | 0 refills | Status: DC | PRN

## 2019-08-11 MED ORDER — CLINDAMYCIN HCL 300 MG PO CAPS: 300.0000 mg | ORAL_CAPSULE | Freq: Three times a day (TID) | ORAL | 0 refills | Status: AC

## 2019-08-11 MED FILL — CLINDAMYCIN HCL 300 MG CAP: 300 | 8 days supply | Qty: 24 | Fill #0

## 2019-08-11 MED FILL — IBUPROFEN 400 MG TABS: 400 | 8 days supply | Qty: 30 | Fill #0

## 2019-08-11 NOTE — Plan of Care (Signed)

## 2019-08-11 NOTE — Discharge Summary (Signed)
Physician Discharge Summary  Juan Hines UEA:[id number] DOB: [birth year] DOA: 08/09/2019  PCP: Patient, No Pcp Per  Admit date: 08/09/2019  Discharge date: 08/11/2019  Admitted From:Home  Disposition:  Home  Recommendations for Outpatient Follow-up:  1. Follow up with PCP in 1-2 weeks 2. Follow-up with oral maxillofacial surgery as recommended Dr. Glenford Peers 3. Continue on clindamycin for 8 more days as prescribed to complete course of treatment  Home Health: None  Equipment/Devices: None  Discharge Condition: Stable  CODE STATUS: Full  Diet recommendation: Regular/soft  Brief/Interim Summary: 28 year old male with no known PMH, presented to [facility] ED on 11/9 due to 3 to 4 days history of right lower tooth pain which progressively worsened with associated swelling of the right lower face/jaw and some problems with swallowing with excessive salivation.  Admitted for dental abscess with facial cellulitis.  No current concerns for airway compromise.  He was seen by dentist Dr. Enrique Sack on 11/10 with recommendations to continue on clindamycin and to follow-up with Dr. Glenford Peers with oral maxillofacial surgery once discharged.  Patient understands and does not have any significant pain on day of discharge and is able to tolerate oral intake without any significant difficulty.  He is otherwise stable for discharge today.  There are no signs of airway compromise and no need for ENT evaluation at this point in time.  Discharge Diagnoses:  Principal Problem:   Dental abscess Active Problems:   Facial cellulitis  Principal discharge diagnosis: Dental abscess with facial cellulitis.  Discharge Instructions  Discharge Instructions    Diet - low sodium heart healthy   Complete by: As directed    Increase activity slowly   Complete by: As directed      Allergies as of 08/11/2019   No Known Allergies     Medication List    TAKE these medications   clindamycin 300 MG capsule Commonly  known as: Cleocin Take 1 capsule (300 mg total) by mouth 3 (three) times daily for 8 days.   ibuprofen 400 MG tablet Commonly known as: ADVIL Take 1 tablet (400 mg total) by mouth every 6 (six) hours as needed for mild pain or moderate pain.      Follow-up Information    Ronal Fear, MD. Call.   Specialty: Plastic Surgery Why: CALL TO SCHEDULE FOLLOW UP ORAL SURGERY APPOINTMENT AFTER DISCHARGE FOR MANAGEMENT OF RIGHT FACIAL SWELLING/DENTAL ABSCESS Contact information: [address] [place] [address] [phone]          No Known Allergies  Consultations:  Dental medicine   Procedures/Studies: Dg Orthopantogram  Result Date: 08/10/2019 CLINICAL DATA:  Right lower jaw/facial swelling EXAM: ORTHOPANTOGRAM/PANORAMIC COMPARISON:  None. FINDINGS: Multiple dental caries present including teeth 1, 14, 16, and 30. There is periapical lucency at tooth 30. No destructive lesion. IMPRESSION: Multiple dental caries.  Periapical lucency at tooth #30. Electronically Signed   By: Macy Mis M.D.   On: 08/10/2019 13:07   Ct Soft Tissue Neck W Contrast  Result Date: 08/09/2019 CLINICAL DATA:  Sore throat and stridor. EXAM: CT NECK WITH CONTRAST TECHNIQUE: Multidetector CT imaging of the neck was performed using the standard protocol following the bolus administration of intravenous contrast. CONTRAST:  57mL OMNIPAQUE IOHEXOL 300 MG/ML  SOLN COMPARISON:  None. FINDINGS: PHARYNX AND LARYNX: --Nasopharynx: Fossae of Rosenmuller are clear. Normal adenoid tonsils for age. --Oral cavity and oropharynx: There is a large periapical lucency at the root of tooth 30 with dehiscence of the anterior mandibular cortex. There is  a subperiosteal abscess measuring 2.4 by 1.4 cm. There is a large amount of overlying soft tissue swelling. --Hypopharynx: Normal vallecula and pyriform sinuses. --Larynx: Normal epiglottis and pre-epiglottic space. Normal aryepiglottic and vocal folds.  --Retropharyngeal space: No abscess, effusion or lymphadenopathy. SALIVARY GLANDS: --Parotid: No mass lesion or inflammation. No sialolithiasis or ductal dilatation. --Submandibular: Symmetric without inflammation. No sialolithiasis or ductal dilatation. --Sublingual: Normal. No ranula or other visible lesion of the base of tongue and floor of mouth. THYROID: Normal. LYMPH NODES: Level 1A lymph nodes measure up to 8 mm. There are numerous bilateral subcentimeter cervical lymph nodes. VASCULAR: Major cervical vessels are patent. LIMITED INTRACRANIAL: Normal. VISUALIZED ORBITS: Normal. MASTOIDS AND VISUALIZED PARANASAL SINUSES: No fluid levels or advanced mucosal thickening. No mastoid effusion. SKELETON: No bony spinal canal stenosis. No lytic or blastic lesions. UPPER CHEST: Clear. OTHER: None. IMPRESSION: 1. Large periapical lucency at the root of tooth 30 with dehiscence of the anterior mandibular cortex and subperiosteal abscess measuring up to 2.4 cm. 2. Inflammation of the lower right facial soft tissues, consistent with odontogenic cellulitis. 3. Reactive cervical lymphadenopathy. Electronically Signed   By: Deatra Robinson M.D.   On: 08/09/2019 20:43     Discharge Exam: Vitals:   08/10/19 1400 08/10/19 1945  BP: (!) 112/57 122/68  Pulse: 74 71  Resp: 16 18  Temp: 98.4 F (36.9 C) 98.3 F (36.8 C)  SpO2: 100% 100%   Vitals:   08/10/19 1020 08/10/19 1048 08/10/19 1400 08/10/19 1945  BP: 116/68 117/69 (!) 112/57 122/68  Pulse: 78 76 74 71  Resp: 16 18 16 18   Temp:  98.3 F (36.8 C) 98.4 F (36.9 C) 98.3 F (36.8 C)  TempSrc:  Oral Oral Oral  SpO2: 98% 100% 100% 100%  Weight:      Height:        General: Pt is alert, awake, not in acute distress, right-sided facial swelling with minimal tenderness and erythema or warmth. Cardiovascular: RRR, S1/S2 +, no rubs, no gallops Respiratory: CTA bilaterally, no wheezing, no rhonchi Abdominal: Soft, NT, ND, bowel sounds + Extremities: no  edema, no cyanosis    The results of significant diagnostics from this hospitalization (including imaging, microbiology, ancillary and laboratory) are listed below for reference.     Microbiology: Recent Results (from the past 240 hour(s))  SARS CORONAVIRUS 2 (TAT 6-24 HRS) Nasopharyngeal Nasopharyngeal Swab     Status: None   Collection Time: 08/09/19 10:24 PM   Specimen: Nasopharyngeal Swab  Result Value Ref Range Status   SARS Coronavirus 2 NEGATIVE NEGATIVE Final    Comment: (NOTE) SARS-CoV-2 target nucleic acids are NOT DETECTED. The SARS-CoV-2 RNA is generally detectable in upper and lower respiratory specimens during the acute phase of infection. Negative results do not preclude SARS-CoV-2 infection, do not rule out co-infections with other pathogens, and should not be used as the sole basis for treatment or other patient management decisions. Negative results must be combined with clinical observations, patient history, and epidemiological information. The expected result is Negative. Fact Sheet for Patients: 13/09/20 Fact Sheet for Healthcare Providers: HairSlick.no This test is not yet approved or cleared by the [place] FDA and  has been authorized for detection and/or diagnosis of SARS-CoV-2 by FDA under an Emergency Use Authorization (EUA). This EUA will remain  in effect (meaning this test can be used) for the duration of the COVID-19 declaration under Section 56 4(b)(1) of the Act, 21 U.S.C. section 360bbb-3(b)(1), unless the authorization is terminated or revoked  sooner. Performed at [facility], [address] [place], [place] [address]      Labs: BNP (last 3 results) No results for input(s): BNP in the last 8760 hours. Basic Metabolic Panel: Recent Labs  Lab 08/09/19 1743 08/10/19 0501  NA 138 135  K 3.7 4.3  CL 100 102  CO2 27 24  GLUCOSE 104* 145*  BUN 11 9  CREATININE  1.02 1.08  CALCIUM 9.2 8.9   Liver Function Tests: Recent Labs  Lab 08/09/19 1743 08/10/19 0501  AST 16 16  ALT 12 10  ALKPHOS 57 53  BILITOT 0.9 0.7  PROT 7.3 6.2*  ALBUMIN 4.0 3.3*   No results for input(s): LIPASE, AMYLASE in the last 168 hours. No results for input(s): AMMONIA in the last 168 hours. CBC: Recent Labs  Lab 08/09/19 1743 08/10/19 0501  WBC 9.5 8.8  NEUTROABS 6.6  --   HGB 14.3 13.6  HCT 43.5 41.2  MCV 90.2 90.0  PLT 194 197   Cardiac Enzymes: No results for input(s): CKTOTAL, CKMB, CKMBINDEX, TROPONINI in the last 168 hours. BNP: Invalid input(s): POCBNP CBG: No results for input(s): GLUCAP in the last 168 hours. D-Dimer No results for input(s): DDIMER in the last 72 hours. Hgb A1c No results for input(s): HGBA1C in the last 72 hours. Lipid Profile No results for input(s): CHOL, HDL, LDLCALC, TRIG, CHOLHDL, LDLDIRECT in the last 72 hours. Thyroid function studies No results for input(s): TSH, T4TOTAL, T3FREE, THYROIDAB in the last 72 hours.  Invalid input(s): FREET3 Anemia work up No results for input(s): VITAMINB12, FOLATE, FERRITIN, TIBC, IRON, RETICCTPCT in the last 72 hours. Urinalysis No results found for: COLORURINE, APPEARANCEUR, LABSPEC, PHURINE, GLUCOSEU, HGBUR, BILIRUBINUR, KETONESUR, PROTEINUR, UROBILINOGEN, NITRITE, LEUKOCYTESUR Sepsis Labs Invalid input(s): PROCALCITONIN,  WBC,  LACTICIDVEN Microbiology Recent Results (from the past 240 hour(s))  SARS CORONAVIRUS 2 (TAT 6-24 HRS) Nasopharyngeal Nasopharyngeal Swab     Status: None   Collection Time: 08/09/19 10:24 PM   Specimen: Nasopharyngeal Swab  Result Value Ref Range Status   SARS Coronavirus 2 NEGATIVE NEGATIVE Final    Comment: (NOTE) SARS-CoV-2 target nucleic acids are NOT DETECTED. The SARS-CoV-2 RNA is generally detectable in upper and lower respiratory specimens during the acute phase of infection. Negative results do not preclude SARS-CoV-2 infection, do not rule  out co-infections with other pathogens, and should not be used as the sole basis for treatment or other patient management decisions. Negative results must be combined with clinical observations, patient history, and epidemiological information. The expected result is Negative. Fact Sheet for Patients: HairSlick.nohttps://www.fda.gov/media/138098/download Fact Sheet for Healthcare Providers: quierodirigir.comhttps://www.fda.gov/media/138095/download This test is not yet approved or cleared by the [place] FDA and  has been authorized for detection and/or diagnosis of SARS-CoV-2 by FDA under an Emergency Use Authorization (EUA). This EUA will remain  in effect (meaning this test can be used) for the duration of the COVID-19 declaration under Section 56 4(b)(1) of the Act, 21 U.S.C. section 360bbb-3(b)(1), unless the authorization is terminated or revoked sooner. Performed at [facility], [address] [place], [place] [address]      Time coordinating discharge: 35 minutes  SIGNED:   Erick BlinksPratik D Yolandra Habig, DO [facility] 08/11/2019, 9:00 AM  If 7PM-7AM, please contact night-coverage [email or web address]

## 2019-08-11 NOTE — Progress Notes (Signed)
  August 11, 2019  Patient: Juan Hines  Date of Birth: Mar 02, 1991  Date of Visit: 08/09/2019    To Whom It May Concern:  Fernando Stoiber was seen and treated in our Hospital  on 08/09/2019. Rondel Episcopo  may return to work on 08/12/19.  Sincerely,

## 2019-10-20 ENCOUNTER — Emergency Department (HOSPITAL_COMMUNITY): Payer: Self-pay

## 2019-10-20 ENCOUNTER — Encounter (HOSPITAL_COMMUNITY): Payer: Self-pay | Admitting: Emergency Medicine

## 2019-10-20 ENCOUNTER — Other Ambulatory Visit: Payer: Self-pay

## 2019-10-20 ENCOUNTER — Observation Stay (HOSPITAL_COMMUNITY)
Admission: EM | Admit: 2019-10-20 | Discharge: 2019-10-20 | Disposition: A | Discharge status: Home / Self Care | Payer: Self-pay | Attending: Physician Assistant | Admitting: Physician Assistant

## 2019-10-20 ENCOUNTER — Observation Stay (HOSPITAL_COMMUNITY): Payer: Self-pay

## 2019-10-20 DIAGNOSIS — S270XXA Traumatic pneumothorax, initial encounter: Secondary | ICD-10-CM | POA: Diagnosis present

## 2019-10-20 DIAGNOSIS — K047 Periapical abscess without sinus: Secondary | ICD-10-CM | POA: Insufficient documentation

## 2019-10-20 DIAGNOSIS — Y9389 Activity, other specified: Secondary | ICD-10-CM | POA: Insufficient documentation

## 2019-10-20 DIAGNOSIS — S272XXA Traumatic hemopneumothorax, initial encounter: Principal | ICD-10-CM | POA: Insufficient documentation

## 2019-10-20 DIAGNOSIS — Z20822 Contact with and (suspected) exposure to covid-19: Secondary | ICD-10-CM | POA: Insufficient documentation

## 2019-10-20 DIAGNOSIS — S27331A Laceration of lung, unilateral, initial encounter: Secondary | ICD-10-CM | POA: Insufficient documentation

## 2019-10-20 DIAGNOSIS — Z791 Long term (current) use of non-steroidal anti-inflammatories (NSAID): Secondary | ICD-10-CM | POA: Insufficient documentation

## 2019-10-20 DIAGNOSIS — S21112A Laceration without foreign body of left front wall of thorax without penetration into thoracic cavity, initial encounter: Secondary | ICD-10-CM

## 2019-10-20 DIAGNOSIS — F17210 Nicotine dependence, cigarettes, uncomplicated: Secondary | ICD-10-CM | POA: Insufficient documentation

## 2019-10-20 DIAGNOSIS — Z79899 Other long term (current) drug therapy: Secondary | ICD-10-CM | POA: Insufficient documentation

## 2019-10-20 DIAGNOSIS — T148XXA Other injury of unspecified body region, initial encounter: Secondary | ICD-10-CM

## 2019-10-20 HISTORY — DX: Unspecified asthma, uncomplicated: J45.909

## 2019-10-20 HISTORY — DX: Other injury of unspecified body region, initial encounter: T14.8XXA

## 2019-10-20 LAB — COMPREHENSIVE METABOLIC PANEL
ALT: 16 U/L (ref 0–44)
AST: 23 U/L (ref 15–41)
Albumin: 4.1 g/dL (ref 3.5–5.0)
Alkaline Phosphatase: 63 U/L (ref 38–126)
Anion gap: 11 (ref 5–15)
BUN: 11 mg/dL (ref 6–20)
CO2: 24 mmol/L (ref 22–32)
Calcium: 8.6 mg/dL — ABNORMAL LOW (ref 8.9–10.3)
Chloride: 104 mmol/L (ref 98–111)
Creatinine, Ser: 1.37 mg/dL — ABNORMAL HIGH (ref 0.61–1.24)
GFR calc Af Amer: >60 mL/min (ref >60)
GFR calc non Af Amer: >60 mL/min (ref >60)
Glucose, Bld: 185 mg/dL — ABNORMAL HIGH (ref 70–99)
Potassium: 3.3 mmol/L — ABNORMAL LOW (ref 3.5–5.1)
Sodium: 139 mmol/L (ref 135–145)
Total Bilirubin: 0.4 mg/dL (ref 0.3–1.2)
Total Protein: 6.8 g/dL (ref 6.5–8.1)

## 2019-10-20 LAB — PROTIME-INR
INR: 1 (ref 0.8–1.2)
Prothrombin Time: 12.7 seconds (ref 11.4–15.2)

## 2019-10-20 LAB — LACTIC ACID, PLASMA: Lactic Acid, Venous: 2.1 mmol/L (ref 0.5–1.9)

## 2019-10-20 LAB — RESPIRATORY PANEL BY RT PCR (FLU A&B, COVID)
Influenza A by PCR: NEGATIVE
Influenza B by PCR: NEGATIVE
SARS Coronavirus 2 by RT PCR: NEGATIVE

## 2019-10-20 LAB — CBC
HCT: 43.6 % (ref 39.0–52.0)
HCT: 40.6 % (ref 39.0–52.0)
Hemoglobin: 14.1 g/dL (ref 13.0–17.0)
Hemoglobin: 13.3 g/dL (ref 13.0–17.0)
MCH: 29.7 pg (ref 26.0–34.0)
MCH: 29.9 pg (ref 26.0–34.0)
MCHC: 32.3 g/dL (ref 30.0–36.0)
MCHC: 32.8 g/dL (ref 30.0–36.0)
MCV: 91.8 fL (ref 80.0–100.0)
MCV: 91.2 fL (ref 80.0–100.0)
Platelets: 220 10*3/uL (ref 150–400)
Platelets: 191 10*3/uL (ref 150–400)
RBC: 4.75 MIL/uL (ref 4.22–5.81)
RBC: 4.45 MIL/uL (ref 4.22–5.81)
RDW: 13.9 % (ref 11.5–15.5)
RDW: 14.1 % (ref 11.5–15.5)
WBC: 9.9 10*3/uL (ref 4.0–10.5)
WBC: 7.9 10*3/uL (ref 4.0–10.5)
nRBC: 0 % (ref 0.0–0.2)
nRBC: 0 % (ref 0.0–0.2)

## 2019-10-20 LAB — BASIC METABOLIC PANEL
Anion gap: 10 (ref 5–15)
BUN: 9 mg/dL (ref 6–20)
CO2: 26 mmol/L (ref 22–32)
Calcium: 8.4 mg/dL — ABNORMAL LOW (ref 8.9–10.3)
Chloride: 104 mmol/L (ref 98–111)
Creatinine, Ser: 1.1 mg/dL (ref 0.61–1.24)
GFR calc Af Amer: >60 mL/min (ref >60)
GFR calc non Af Amer: >60 mL/min (ref >60)
Glucose, Bld: 130 mg/dL — ABNORMAL HIGH (ref 70–99)
Potassium: 3.6 mmol/L (ref 3.5–5.1)
Sodium: 140 mmol/L (ref 135–145)

## 2019-10-20 LAB — SAMPLE TO BLOOD BANK

## 2019-10-20 LAB — ETHANOL: Alcohol, Ethyl (B): <10 mg/dL (ref <10)

## 2019-10-20 LAB — CDS SEROLOGY

## 2019-10-20 MED ORDER — DOCUSATE SODIUM 100 MG PO CAPS
100.0000 mg | ORAL_CAPSULE | Freq: Two times a day (BID) | ORAL | Status: DC
  Administered 2019-10-20: 10:00:00 100 mg via ORAL
  Filled 2019-10-20: qty 1

## 2019-10-20 MED ORDER — METHOCARBAMOL 500 MG PO TABS: 1000.0000 mg | ORAL_TABLET | Freq: Three times a day (TID) | ORAL | 0 refills | Status: AC | PRN

## 2019-10-20 MED ORDER — ACETAMINOPHEN 325 MG PO TABS: 650.0000 mg | ORAL_TABLET | ORAL | Status: DC | PRN

## 2019-10-20 MED ORDER — ONDANSETRON 4 MG PO TBDP: 4.0000 mg | ORAL_TABLET | Freq: Four times a day (QID) | ORAL | Status: DC | PRN

## 2019-10-20 MED ORDER — ACETAMINOPHEN 325 MG PO TABS: 650.0000 mg | ORAL_TABLET | Freq: Four times a day (QID) | ORAL | Status: AC | PRN

## 2019-10-20 MED ORDER — IOHEXOL 350 MG/ML SOLN
75.0000 mL | Freq: Once | INTRAVENOUS | Status: AC | PRN
  Administered 2019-10-20: 01:00:00 75 mL via INTRAVENOUS

## 2019-10-20 MED ORDER — OXYCODONE HCL 5 MG PO TABS
5.0000 mg | ORAL_TABLET | ORAL | Status: DC | PRN
  Administered 2019-10-20: 10 mg via ORAL
  Filled 2019-10-20: qty 2

## 2019-10-20 MED ORDER — ONDANSETRON HCL 4 MG/2ML IJ SOLN: 4.0000 mg | Freq: Four times a day (QID) | INTRAMUSCULAR | Status: DC | PRN

## 2019-10-20 MED ORDER — OXYCODONE HCL 5 MG PO TABS: 5.0000 mg | ORAL_TABLET | Freq: Four times a day (QID) | ORAL | 0 refills | Status: DC | PRN

## 2019-10-20 MED ORDER — LACTATED RINGERS IV BOLUS
1000.0000 mL | Freq: Once | INTRAVENOUS | Status: AC
  Administered 2019-10-20: 03:00:00 1000 mL via INTRAVENOUS

## 2019-10-20 MED ORDER — ENOXAPARIN SODIUM 30 MG/0.3ML ~~LOC~~ SOLN
30.0000 mg | Freq: Two times a day (BID) | SUBCUTANEOUS | Status: DC
  Administered 2019-10-20: 10:00:00 30 mg via SUBCUTANEOUS
  Filled 2019-10-20: qty 0.3

## 2019-10-20 MED ORDER — DOCUSATE SODIUM 100 MG PO CAPS: 100.0000 mg | ORAL_CAPSULE | Freq: Two times a day (BID) | ORAL | 0 refills | Status: AC

## 2019-10-20 MED ORDER — METHOCARBAMOL 500 MG PO TABS
1000.0000 mg | ORAL_TABLET | Freq: Three times a day (TID) | ORAL | Status: DC
  Administered 2019-10-20: 16:00:00 1000 mg via ORAL
  Filled 2019-10-20 (×2): qty 2

## 2019-10-20 MED FILL — oxyCODONE HCL 5 MG TABS: 5 | 3 days supply | Qty: 20 | Fill #0

## 2019-10-20 MED FILL — METHOCARBAMOL 500 MG TABS: 500 | 3 days supply | Qty: 20 | Fill #0

## 2019-10-20 NOTE — Progress Notes (Signed)
Juan Hines to be D/C'd  per MD order. Discussed with the patient and all questions fully answered.  VSS, Skin clean, dry and intact without evidence of skin break down, no evidence of skin tears noted.  IV catheter discontinued intact. Site without signs and symptoms of complications. Dressing and pressure applied.  An After Visit Summary was printed and given to the patient. Patient received prescription.  D/c education completed with patient/family including follow up instructions, medication list, d/c activities limitations if indicated, with other d/c instructions as indicated by MD - patient able to verbalize understanding, all questions fully answered.   Patient instructed to return to ED, call 911, or call MD for any changes in condition.   Patient to be escorted via WC, and D/C home via private auto.
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
## 2019-10-20 NOTE — ED Notes (Signed)
Right arm BP 119/92 (100) Left arm BP 138/75 (94)

## 2019-10-20 NOTE — ED Notes (Addendum)
Pt transported to CT ?

## 2019-10-20 NOTE — ED Notes (Signed)
Dry sterile dressing applied 
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
## 2019-10-20 NOTE — Plan of Care (Signed)

## 2019-10-20 NOTE — ED Notes (Signed)
Trauma MD Dr.Lovick at bedside
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
## 2020-03-29 ENCOUNTER — Other Ambulatory Visit: Payer: Self-pay

## 2020-03-29 DIAGNOSIS — R220 Localized swelling, mass and lump, head: Secondary | ICD-10-CM | POA: Insufficient documentation

## 2020-03-29 DIAGNOSIS — K0889 Other specified disorders of teeth and supporting structures: Secondary | ICD-10-CM | POA: Insufficient documentation

## 2020-03-29 DIAGNOSIS — Z720 Tobacco use: Secondary | ICD-10-CM | POA: Insufficient documentation

## 2020-03-29 DIAGNOSIS — R590 Localized enlarged lymph nodes: Secondary | ICD-10-CM | POA: Insufficient documentation

## 2020-03-30 ENCOUNTER — Encounter (HOSPITAL_COMMUNITY): Payer: Self-pay | Admitting: Emergency Medicine

## 2020-03-30 ENCOUNTER — Other Ambulatory Visit: Payer: Self-pay

## 2020-03-30 ENCOUNTER — Emergency Department (HOSPITAL_COMMUNITY)
Admission: EM | Admit: 2020-03-30 | Discharge: 2020-03-30 | Disposition: A | Discharge status: Home / Self Care | Payer: Self-pay | Attending: Emergency Medicine | Admitting: Emergency Medicine

## 2020-03-30 DIAGNOSIS — K0889 Other specified disorders of teeth and supporting structures: Secondary | ICD-10-CM

## 2020-03-30 DIAGNOSIS — R220 Localized swelling, mass and lump, head: Secondary | ICD-10-CM

## 2020-03-30 MED ORDER — AMOXICILLIN-POT CLAVULANATE 875-125 MG PO TABS: 1.0000 | ORAL_TABLET | Freq: Two times a day (BID) | ORAL | 0 refills | Status: AC

## 2020-03-30 MED ORDER — LIDOCAINE VISCOUS HCL 2 % MT SOLN: 15.0000 mL | OROMUCOSAL | 2 refills | Status: AC | PRN

## 2020-03-30 MED ORDER — IBUPROFEN 600 MG PO TABS: 600.0000 mg | ORAL_TABLET | Freq: Four times a day (QID) | ORAL | 0 refills | Status: AC | PRN

## 2020-03-30 MED FILL — AMOX-CLAV 875-125 MG TABLET: 875-125 | 5 days supply | Qty: 10 | Fill #0

## 2020-03-30 MED FILL — LIDOCAINE 2% VISCOUS SOLN: 2 | 10 days supply | Qty: 100 | Fill #0

## 2020-03-30 MED FILL — IBUPROFEN 600 MG TABLET: 600 | 7 days supply | Qty: 30 | Fill #0
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
## 2020-03-30 NOTE — Discharge Instructions (Addendum)
  Dental Pain You have been seen today for dental pain. You should follow up with a dentist as soon as possible. This problem will not resolve on its own without the care of a dentist.  Lidocaine liquid: Use the viscous lidocaine for mouth pain. Swish with the lidocaine and spit it out. Do not swallow it. Salt water solution: You should also swish with a homemade salt water solution, twice a day.  Make this solution by mixing 8 ounces of warm water with about half a teaspoon of salt. Antiinflammatory medications: Take 600 mg of ibuprofen every 6 hours or 440 mg (over the counter dose) to 500 mg (prescription dose) of naproxen every 12 hours for the next 3 days. After this time, these medications may be used as needed for pain. Take these medications with food to avoid upset stomach. Choose only one of these medications, do not take them together. Acetaminophen (generic for Tylenol): Should you continue to have additional pain while taking the ibuprofen or naproxen, you may add in acetaminophen as needed. Your daily total maximum amount of acetaminophen from all sources should be limited to 4000mg/day for persons without liver problems, or 2000mg/day for those with liver problems.  Please take all of your antibiotics until finished!   You may develop abdominal discomfort or diarrhea from the antibiotic.  You may help offset this with probiotics which you can buy or get in yogurt. Do not eat or take the probiotics until 2 hours after your antibiotic.   For prescription assistance, may try using prescription discount sites or apps, such as goodrx.com   Dental Resource Guide  [facility] [address], [address] [place], [place] [address] [phone]  [facility] [address] [place], [place] [address] [phone]  [facility] [address] [place], [place] [address] [phone]  [facility] [address], [address] [place], [place] [address] [phone]  [facility] [address] [place], [place] [address] [phone]  [employer] [email or web address].denistry.[email or web address].edu/patientcare/[email or web address]/[email or web address]  [employer] [address] [place], [place] [address] [phone]  Website for free, low-income, or sliding scale dental services in [place]: www.freedentalcare.us  To find a dentist in [place] and surrounding areas: www.ncdental.org/for-the-public/find-[email or web address]-dentist  [facility] http://www.ncdental.org/meetings-events/[email or web address]   Medicaid Dentist https://dma.ncdhhs.gov/find-[email or web address]-doctor/medicaid-[email or web address]  
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
